# Patient Record
Sex: Male | Born: 1980
Health system: Southern US, Community
[De-identification: ages and names within clinical notes are randomized; demographics above are authoritative.]

## PROBLEM LIST (undated history)

## (undated) DIAGNOSIS — E162 Hypoglycemia, unspecified: Secondary | ICD-10-CM

## (undated) DIAGNOSIS — F32A Depression, unspecified: Secondary | ICD-10-CM

## (undated) DIAGNOSIS — F329 Major depressive disorder, single episode, unspecified: Secondary | ICD-10-CM

## (undated) DIAGNOSIS — I10 Essential (primary) hypertension: Secondary | ICD-10-CM

## (undated) HISTORY — DX: Essential (primary) hypertension: I10

## (undated) HISTORY — DX: Major depressive disorder, single episode, unspecified: F32.9

## (undated) HISTORY — DX: Depression, unspecified: F32.A

---

## 2011-06-09 ENCOUNTER — Other Ambulatory Visit: Payer: Self-pay

## 2011-06-09 ENCOUNTER — Emergency Department (INDEPENDENT_AMBULATORY_CARE_PROVIDER_SITE_OTHER): Payer: Federal, State, Local not specified - PPO

## 2011-06-09 ENCOUNTER — Encounter (HOSPITAL_BASED_OUTPATIENT_CLINIC_OR_DEPARTMENT_OTHER): Payer: Self-pay | Admitting: *Deleted

## 2011-06-09 ENCOUNTER — Emergency Department (HOSPITAL_BASED_OUTPATIENT_CLINIC_OR_DEPARTMENT_OTHER)
Admission: EM | Admit: 2011-06-09 | Discharge: 2011-06-09 | Disposition: A | Discharge status: Home / Self Care | Payer: Federal, State, Local not specified - PPO | Attending: Emergency Medicine | Admitting: Emergency Medicine

## 2011-06-09 DIAGNOSIS — R079 Chest pain, unspecified: Secondary | ICD-10-CM | POA: Insufficient documentation

## 2011-06-09 DIAGNOSIS — R0602 Shortness of breath: Secondary | ICD-10-CM | POA: Insufficient documentation

## 2011-06-09 DIAGNOSIS — M25569 Pain in unspecified knee: Secondary | ICD-10-CM

## 2011-06-09 HISTORY — DX: Hypoglycemia, unspecified: E16.2

## 2011-06-09 LAB — DIFFERENTIAL
Basophils Relative: 0 % (ref 0–1)
Lymphocytes Relative: 42 % (ref 12–46)
Lymphs Abs: 3.8 10*3/uL (ref 0.7–4.0)
Monocytes Absolute: 0.7 10*3/uL (ref 0.1–1.0)
Monocytes Relative: 8 % (ref 3–12)
Neutro Abs: 4.3 10*3/uL (ref 1.7–7.7)
Neutrophils Relative %: 47 % (ref 43–77)

## 2011-06-09 LAB — BASIC METABOLIC PANEL
BUN: 19 mg/dL (ref 6–23)
CO2: 28 mEq/L (ref 19–32)
Chloride: 101 mEq/L (ref 96–112)
Creatinine, Ser: 0.9 mg/dL (ref 0.50–1.35)
GFR calc Af Amer: >90 mL/min (ref >90)
Potassium: 3.5 mEq/L (ref 3.5–5.1)

## 2011-06-09 LAB — CBC
HCT: 42.7 % (ref 39.0–52.0)
Hemoglobin: 15.1 g/dL (ref 13.0–17.0)
MCHC: 35.4 g/dL (ref 30.0–36.0)
RBC: 5.36 MIL/uL (ref 4.22–5.81)

## 2011-06-09 LAB — D-DIMER, QUANTITATIVE: D-Dimer, Quant: <0.22 ug/mL-FEU (ref 0.00–0.48)

## 2011-06-09 NOTE — ED Provider Notes (Signed)
History   Steven Wilkins is a 31 y.o. male who presents to the Emergency Department complaining of 7:31pm   CSN: [id number]  Arrival date & time 06/09/11  1715   First MD Initiated Contact with Patient 06/09/11 1918      Chief Complaint  Patient presents with  . Knee Pain  . Chest Pain    (Consider location/radiation/quality/duration/timing/severity/associated sxs/prior treatment) HPI Steven Wilkins is a 31 y.o. male who presents to the Emergency Department complaining of intermittent, moderate, sharp left knee pain that has been occuring for the past 2 months. Patient states that the pain is in the back of his left knee and gets worse when he stands initially but better after walking. He is a Paramedic and walks throughout the day.  Patient also reports new left-sided chest pain, chest tightness, SOB, and fatigue that occurred this morning when he he was driving. Patient states the chest pain went away with breathing and relaxation. Patient also notes the chest pain and SOB occurred 2 weeks ago but subsided without intervention. Patient also states the leg pain radiates to his lower back. Patient denies leg swelling, wheezing, coughing, fever, chills, and numbness, tingling, or weakness in his extremities. Patient denies any recent trips or travel. Patient denies h/o blood clots or cancer. Patient does note a h/o smoking, but has recently stopped. Patient is able to ambulate.   PCP: Dr. Katrinka Blazing    Past Medical History  Diagnosis Date  . Hypoglycemia     History reviewed. No pertinent past surgical history.  No family history on file.  History  Substance Use Topics  . Smoking status: Former Smoker    Quit date: 05/19/2011  . Smokeless tobacco: Never Used  . Alcohol Use: 8.4 oz/week    2 Glasses of wine, 12 Cans of beer per week      Review of Systems A complete 10 system review of systems was obtained and is otherwise negative except as noted in the HPI and PMH.    Allergies  Review of patient's allergies indicates no known allergies.  Home Medications   Current Outpatient Rx  Name Route Sig Dispense Refill  . ADULT MULTIVITAMIN W/MINERALS CH Oral Take 1 tablet by mouth daily.      BP 136/76  Pulse 64  Temp(Src) 98.1 F (36.7 C) (Oral)  Resp 18  Ht 5\' 11"  (1.803 m)  Wt 180 lb (81.647 kg)  BMI 25.10 kg/m2  SpO2 100%  Physical Exam  Nursing note and vitals reviewed. Constitutional: He is oriented to person, place, and time. He appears well-developed and well-nourished. No distress.  HENT:  Head: Normocephalic and atraumatic.  Eyes: EOM are normal. Pupils are equal, round, and reactive to light.  Neck: Neck supple. No tracheal deviation present.  Cardiovascular: Normal rate, regular rhythm, normal heart sounds and intact distal pulses.  Exam reveals no gallop and no friction rub.   No murmur heard. Pulmonary/Chest: Effort normal and breath sounds normal. No respiratory distress. He has no wheezes. He has no rales. He exhibits no tenderness.  Abdominal: Soft. Bowel sounds are normal. He exhibits no distension and no mass. There is no tenderness. There is no rebound and no guarding.  Musculoskeletal: Normal range of motion. He exhibits no edema.       Left lateral popliteal tenderness to palpation. No fullness. Left anterior and posterior drawer test normal. Left knee: no swelling or deformations. Min patella ttp. Distal pulses intact. No swelling or ttp of calf  Neurological: He is alert and oriented to person, place, and time. No sensory deficit.  Skin: Skin is warm and dry.  Psychiatric: He has a normal mood and affect. His behavior is normal.    Date: 06/10/2011  Rate: 69  Rhythm: normal sinus rhythm and sinus arrhythmia  QRS Axis: normal  Intervals: normal  ST/T Wave abnormalities: normal  Conduction Disutrbances:none  Narrative Interpretation:   Old EKG Reviewed: none available   ED Course  Procedures (including critical  care time)  DIAGNOSTIC STUDIES: Oxygen Saturation is 100% on room air, normal by my interpretation.    COORDINATION OF CARE:     Labs Reviewed  BASIC METABOLIC PANEL  D-DIMER, QUANTITATIVE  CBC  DIFFERENTIAL   No results found.   1. Knee pain   2. Chest pain       MDM  Patient presents with multiple complaints. He has both knee pain and atypical chest pain which I feel are unrelated. He appears well here and states he feels like it is anxiety. Low risk ACS. PERC negative and negative d dimer. Exam not c/w DVT LE. Will have him f/u with his PMD. Given precautions for return. Comfortable with discharge home.    I personally performed the services described in this documentation, which was scribed in my presence. The recorded information has been reviewed and considered.        Forbes Cellar, MD 06/12/11 [phone]

## 2011-06-09 NOTE — ED Notes (Signed)
Pt reports pain in back of knee which started after running "tight"- also reports recurrent chest discomfort "pressure"- states sx started this am around 0800

## 2015-08-24 ENCOUNTER — Ambulatory Visit (INDEPENDENT_AMBULATORY_CARE_PROVIDER_SITE_OTHER): Payer: Federal, State, Local not specified - PPO | Admitting: Family Medicine

## 2015-08-24 VITALS — BP 135/76 | HR 60 | Temp 97.6°F | Resp 16 | Ht 71.5 in | Wt 189.0 lb

## 2015-08-24 DIAGNOSIS — F4323 Adjustment disorder with mixed anxiety and depressed mood: Secondary | ICD-10-CM

## 2015-08-24 MED ORDER — SERTRALINE HCL 50 MG PO TABS
50.0000 mg | ORAL_TABLET | Freq: Every day | ORAL | Status: DC
Start: 2015-08-24 — End: 2015-08-25

## 2015-08-24 NOTE — Patient Instructions (Signed)
     IF you received an x-ray today, you will receive an invoice from [facility]. Please contact [facility] at [phone] with questions or concerns regarding your invoice.   IF you received labwork today, you will receive an invoice from [facility]. Please contact [facility] at [phone] with questions or concerns regarding your invoice.   Our billing staff will not be able to assist you with questions regarding bills from these companies.  You will be contacted with the lab results as soon as they are available. The fastest way to get your results is to activate your My Chart account. Instructions are located on the last page of this paperwork. If you have not heard from us regarding the results in 2 weeks, please contact this office.      
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
## 2015-08-24 NOTE — Progress Notes (Deleted)
   08/24/2015 11:08 AM   DOB: 02-07-1981 / MRN: [id number]  SUBJECTIVE:  Kenard GowerRaul Mccready is a 35 y.o. male presenting for right sided back pain that started 4 days ago and is worsening.  He complains that the pain radiates to his abdomen. His pain is worse with movement, particularly bending over and going from seated to standing, and states that his abdominal pain is worse with these movements as well.  He has not tried anything for his symptoms.  Denies leg pain, leg weakness and numbness.  No change in bowel and bladder.  No dysuria, urgency, frequency.   He has No Known Allergies.   He  has a past medical history of Hypoglycemia.    He  reports that he quit smoking about 4 years ago. He has never used smokeless tobacco. He reports that he drinks about 8.4 oz of alcohol per week. He reports that he does not use illicit drugs. He  has no sexual activity history on file. The patient  has no past surgical history on file.  His family history is not on file.  Review of Systems  Constitutional: Negative for fever.  Gastrointestinal: Positive for abdominal pain. Negative for nausea, diarrhea and constipation.  Genitourinary: Negative for dysuria, urgency and frequency.  Musculoskeletal: Positive for myalgias and back pain. Negative for falls.  Neurological: Negative for dizziness and headaches.    Problem list and medications reviewed and updated by myself where necessary, and exist elsewhere in the encounter.   OBJECTIVE:  BP 135/76 mmHg  Pulse 60  Temp(Src) 97.6 F (36.4 C) (Oral)  Resp 16  Ht 5' 11.5" (1.816 m)  Wt 189 lb (85.73 kg)  BMI 26.00 kg/m2  SpO2 99%  Physical Exam  Constitutional: He is oriented to person, place, and time. He appears well-developed. He does not appear ill.  Eyes: Conjunctivae and EOM are normal. Pupils are equal, round, and reactive to light.  Cardiovascular: Normal rate.   Pulmonary/Chest: Effort normal.  Abdominal: He exhibits no distension.    Musculoskeletal: Normal range of motion.       Lumbar back: He exhibits tenderness and spasm. He exhibits normal range of motion, no bony tenderness, no edema and no deformity.       Back:  Neurological: He is alert and oriented to person, place, and time. He has normal strength. No cranial nerve deficit or sensory deficit. Coordination and gait normal. GCS eye subscore is 4. GCS verbal subscore is 5. GCS motor subscore is 6.  Reflex Scores:      Patellar reflexes are 2+ on the right side and 2+ on the left side.      Achilles reflexes are 2+ on the right side and 2+ on the left side. Skin: Skin is warm and dry. No rash noted. He is not diaphoretic.  Psychiatric: He has a normal mood and affect.  Nursing note and vitals reviewed.   No results found for this or any previous visit (from the past 72 hour(s)).  No results found.  ASSESSMENT AND PLAN  There are no diagnoses linked to this encounter.  The patient was advised to call or return to clinic if he does not see an improvement in symptoms or to seek the care of the closest emergency department if he worsens with the above plan.   Deliah BostonMichael Clark, MHS, PA-C [facility] 08/24/2015 11:08 AM

## 2015-08-25 ENCOUNTER — Telehealth: Payer: Self-pay | Admitting: Family Medicine

## 2015-08-25 DIAGNOSIS — F4323 Adjustment disorder with mixed anxiety and depressed mood: Secondary | ICD-10-CM

## 2015-08-25 MED ORDER — AMITRIPTYLINE HCL 25 MG PO TABS
25.0000 mg | ORAL_TABLET | Freq: Every evening | ORAL | Status: DC | PRN
Start: 2015-08-25 — End: 2017-09-13

## 2015-08-25 NOTE — Telephone Encounter (Signed)
Patient was started on Sertraline yesterday and when he took the medication it caused him to have a "bad stomach ache". When I asked for details he stated it made him feel nauseated, couldn't eat, changed his mood, felt "numb" all day, and just didn't feel right. He wants to know if you could prescribe a different medication for him to try. Please advise

## 2015-08-25 NOTE — Telephone Encounter (Signed)
Pt advised.

## 2015-08-27 ENCOUNTER — Telehealth: Payer: Self-pay

## 2015-08-27 NOTE — Telephone Encounter (Signed)
Patient brought in paperwork for Dr. Milus GlazierLauenstein to fill out and he wants to know how long Dr. Milus GlazierLauenstein stateD he should be out of work. Please call!  [phone]

## 2015-08-29 NOTE — Telephone Encounter (Signed)
Patient needs to remain out of work for one month starting April 10.

## 2016-02-27 DIAGNOSIS — K080 Exfoliation of teeth due to systemic causes: Secondary | ICD-10-CM | POA: Diagnosis not present

## 2016-12-11 DIAGNOSIS — J0100 Acute maxillary sinusitis, unspecified: Secondary | ICD-10-CM | POA: Diagnosis not present

## 2017-04-08 DIAGNOSIS — J3089 Other allergic rhinitis: Secondary | ICD-10-CM | POA: Diagnosis not present

## 2017-04-08 DIAGNOSIS — R51 Headache: Secondary | ICD-10-CM | POA: Diagnosis not present

## 2017-04-17 DIAGNOSIS — K080 Exfoliation of teeth due to systemic causes: Secondary | ICD-10-CM | POA: Diagnosis not present

## 2017-05-20 DIAGNOSIS — Z113 Encounter for screening for infections with a predominantly sexual mode of transmission: Secondary | ICD-10-CM | POA: Diagnosis not present

## 2017-09-13 ENCOUNTER — Encounter: Payer: Self-pay | Admitting: Medical

## 2017-09-13 ENCOUNTER — Ambulatory Visit: Payer: Federal, State, Local not specified - PPO | Admitting: Medical

## 2017-09-13 ENCOUNTER — Ambulatory Visit: Payer: Federal, State, Local not specified - PPO | Admitting: Emergency Medicine

## 2017-09-13 VITALS — BP 118/66 | HR 66 | Temp 98.0°F | Resp 16 | Ht 71.0 in | Wt 185.0 lb

## 2017-09-13 DIAGNOSIS — F329 Major depressive disorder, single episode, unspecified: Secondary | ICD-10-CM

## 2017-09-13 DIAGNOSIS — F419 Anxiety disorder, unspecified: Secondary | ICD-10-CM | POA: Diagnosis not present

## 2017-09-13 DIAGNOSIS — F32A Depression, unspecified: Secondary | ICD-10-CM

## 2017-09-13 MED ORDER — AMITRIPTYLINE HCL 25 MG PO TABS: 25.0000 mg | ORAL_TABLET | Freq: Every day | ORAL | 0 refills | Status: AC

## 2017-09-13 MED ORDER — HYDROXYZINE HCL 25 MG PO TABS: 25.0000 mg | ORAL_TABLET | Freq: Three times a day (TID) | ORAL | 0 refills | Status: AC | PRN

## 2017-09-13 MED FILL — AMITRIPTYLINE HCL 25 MG TAB: 25 | 30 days supply | Qty: 30 | Fill #0

## 2017-09-13 MED FILL — hydrOXYzine HCL 25 MG TABS: 25 | 20 days supply | Qty: 60 | Fill #0
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
## 2017-09-13 NOTE — Patient Instructions (Addendum)
For history of anxiety and depression, I did refill your amitriptyline and prescribed hydroxyzine.  Amitriptyline should help with your mood.  You can use hydroxyzine for anxiety as directed.  If you find that hydroxyzine is not helping then I could prescribe you buspirone.  You expressed desire not to be on a benzodiazepine.  Hopefully will not  be needed.  However if your anxiety becomes extreme please let me know and I can write you a short course.  If any extreme anxiety or depression after hours or weekend then recommend [facility].  Follow-up in 2 weeks or as needed.

## 2017-09-16 ENCOUNTER — Telehealth: Payer: Self-pay | Admitting: Medical

## 2017-09-16 NOTE — Telephone Encounter (Signed)
Copied from CRM [id number]. Topic: Quick Communication - See Telephone Encounter >> Sep 16, 2017  2:33 PM Valentina Lucks wrote: CRM for notification. See Telephone encounter for: 09/16/17.   Pt dropped off document to be filled out by provider (FMLA 2 pages with front and back). Pt would like document to be faxed to [phone] when document ready and also to call the pt to inform him that documents has been faxed. Document put at front office tray under providers name.

## 2017-09-27 ENCOUNTER — Ambulatory Visit: Payer: Federal, State, Local not specified - PPO | Admitting: Medical

## 2017-09-27 NOTE — Telephone Encounter (Signed)
New patient and cancelled appointment for today; forwarded paperwork to provider/SLS 05/17

## 2017-09-27 NOTE — Telephone Encounter (Signed)
I only saw patient one time. I called him and ask to follow to see how he is so I could know how to fill out form. I have only seen him one time. I left message to that effect on his phone. Would you mind calling him and asking him to come in next week.

## 2017-09-30 NOTE — Telephone Encounter (Signed)
Patient has appointment scheduled for 10/04/17 @ 1:00pm/SLS 05/20

## 2017-10-04 ENCOUNTER — Ambulatory Visit: Payer: Federal, State, Local not specified - PPO | Admitting: Medical

## 2017-10-04 ENCOUNTER — Encounter: Payer: Self-pay | Admitting: Medical

## 2017-10-04 VITALS — BP 119/63 | HR 63 | Temp 98.3°F | Resp 16 | Ht 71.0 in | Wt 188.4 lb

## 2017-10-04 DIAGNOSIS — F419 Anxiety disorder, unspecified: Secondary | ICD-10-CM | POA: Diagnosis not present

## 2017-10-04 DIAGNOSIS — F329 Major depressive disorder, single episode, unspecified: Secondary | ICD-10-CM

## 2017-10-04 DIAGNOSIS — F32A Depression, unspecified: Secondary | ICD-10-CM

## 2017-10-04 NOTE — Patient Instructions (Signed)
For your anxiety and depression, you could try to take half hydroxyzine tablet. Other meds I don't think will help as you have had severe side effects to depression meds.  I filled out your fmla form.  You can schedule appointment for July 3rd in case you need that visit. If you don't need then can cancel.  Follow up as needed  Reminder for extreme depression or anxiety seek eval [facility].

## 2017-10-04 NOTE — Progress Notes (Signed)
Subjective:    Patient ID: Steven Wilkins, male    DOB: 10-09-1980, 37 y.o.   MRN: [id number]  HPI  Pt in for follow up.  He has some moderate to severe anxiety recently as explained for reasons on last visit.  I had written him hydroxyzine for anxiety. He states he did not like the way he felt. He felt too sedated and out of it.  He does not want med such as xanax or buspar.  See last note. He had a lot of reactions to various meds such as SSRI.  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   Pt did have a court date on May 20,2019. They date was extended. Now he may get encarcerated by June or July.    Review of Systems  Constitutional: Negative for chills, fatigue and fever.  Respiratory: Negative for cough, chest tightness, shortness of breath and wheezing.   Cardiovascular: Negative for chest pain and palpitations.  Musculoskeletal: Negative for back pain and neck pain.  Skin: Negative for rash.  Neurological: Negative for dizziness, speech difficulty, weakness, numbness and headaches.  Hematological: Negative for adenopathy. Does not bruise/bleed easily.  Psychiatric/Behavioral: Positive for dysphoric mood. Negative for agitation, behavioral problems, confusion and sleep disturbance. The patient is nervous/anxious. The patient is not hyperactive.      Past Medical History:  Diagnosis Date  . Depression   . Hypertension   . Hypoglycemia      Social History   Socioeconomic History  . Marital status: Married    Spouse name: Not on file  . Number of children: Not on file  . Years of education: Not on file  . Highest education level: Not on file  Occupational History  . Not on file  Social Needs  . Financial resource strain: Not on file  . Food insecurity:    Worry: Not on file    Inability: Not on file  . Transportation needs:    Medical: Not on file    Non-medical: Not on file  Tobacco Use  . Smoking status: Former Smoker   Last attempt to quit: 05/19/2011    Years since quitting: 6.3  . Smokeless tobacco: Never Used  Substance and Sexual Activity  . 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  . Drug use: No  . Sexual activity: Yes  Lifestyle  . Physical activity:    Days per week: Not on file    Minutes per session: Not on file  . Stress: Not on file  Relationships  . Social connections:    Talks on phone: Not on file    Gets together: Not on file    Attends religious service: Not on file    Active member of club or organization: Not on file    Attends meetings of clubs or organizations: Not on file    Relationship status: Not on file  . Intimate partner violence:    Fear of current or ex partner: Not on file    Emotionally abused: Not on file    Physically abused: Not on file    Forced sexual activity: Not on file  Other Topics Concern  . Not on file  Social History Narrative  . Not on file    No past  surgical history on file.  No family history on file.  No Known Allergies  Current Outpatient Medications on File Prior to Visit  Medication Sig Dispense Refill  . amitriptyline (ELAVIL) 25 MG tablet Take 1 tablet (25 mg total) by mouth at bedtime. 30 tablet 0  . hydrOXYzine (ATARAX/VISTARIL) 25 MG tablet Take 1 tablet (25 mg total) by mouth 3 (three) times daily as needed for anxiety. 60 tablet 0  . Multiple Vitamin (MULITIVITAMIN WITH MINERALS) TABS Take 1 tablet by mouth daily. Reported on 08/24/2015     No current facility-administered medications on file prior to visit.     BP 119/63   Pulse 63   Temp 98.3 F (36.8 C) (Oral)   Resp 16   Ht  (1.803 m)   Wt 188 lb 6.4 oz (85.5 kg)   SpO2 100%   BMI 26.28 kg/m       Objective:   Physical Exam  General- No acute distress. Pleasant patient today but appears some stressed as  discusses upcoming court dates. Neck- Full range of motion, no jvd Lungs- Clear, even and unlabored. Heart- regular rate and rhythm. Neurologic- CNII- XII grossly intact.      Assessment & Plan:  For your anxiety and depression, you could try to take half hydroxyzine tablet. Other meds I don't think will help as you have had severe side effects to depression meds.  I filled out your fmla form.  You can schedule appointment for July 3rd in case you need that visit. If you don't need then can cancel.  Follow up as needed  Reminder for extreme depression or anxiety seek eval [facility].  Esperanza Richters, PA-C

## 2017-11-01 ENCOUNTER — Ambulatory Visit: Payer: Federal, State, Local not specified - PPO | Admitting: Medical

## 2017-11-13 ENCOUNTER — Ambulatory Visit: Payer: Federal, State, Local not specified - PPO | Admitting: Medical

## 2017-11-20 ENCOUNTER — Ambulatory Visit: Payer: Federal, State, Local not specified - PPO | Admitting: Medical

## 2017-11-20 ENCOUNTER — Encounter: Payer: Self-pay | Admitting: Medical

## 2017-11-20 VITALS — BP 118/61 | HR 61 | Temp 98.4°F | Resp 16 | Ht 71.0 in | Wt 191.4 lb

## 2017-11-20 DIAGNOSIS — F419 Anxiety disorder, unspecified: Secondary | ICD-10-CM | POA: Diagnosis not present

## 2017-11-20 DIAGNOSIS — F329 Major depressive disorder, single episode, unspecified: Secondary | ICD-10-CM | POA: Diagnosis not present

## 2017-11-20 DIAGNOSIS — F32A Depression, unspecified: Secondary | ICD-10-CM

## 2017-11-20 MED ORDER — CLONAZEPAM 0.25 MG PO TBDP: ORAL_TABLET | ORAL | 0 refills | Status: AC

## 2017-11-20 NOTE — Progress Notes (Signed)
Subjective:    Patient ID: Steven Wilkins, male    DOB: December 17, 1980, 37 y.o.   MRN: [id number]  HPI  Pt in for follow up.  I had filled out paperwork for him to take off for work in past.  Pt states work is threatening him that he needs to go back to work or he may be fired. Then he states union would have to fight his dismissal if that came to it.   Pt states his court date was moved again. He is still expressing some stress and anxiety.  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  Pt has elavil and hydroyxzine. He states he did not like side effect of med. He states he had clouded thinking. He prefers not to use any medication. Still some stress and anxiety but controlled most of time. Not reporting depression.  Pt is willing to dry benzodiazepne today. Discussed use of clonazepam.   Review of Systems  Constitutional: Negative for chills, diaphoresis, fatigue and fever.  HENT: Negative for dental problem.   Respiratory: Negative for cough, chest tightness, shortness of breath and wheezing.   Cardiovascular: Negative for chest pain and palpitations.  Gastrointestinal: Negative for abdominal pain.  Genitourinary: Negative for difficulty urinating, enuresis, flank pain and frequency.  Musculoskeletal: Negative for back pain.  Skin: Negative for rash.  Neurological: Negative for dizziness, seizures, syncope, speech difficulty, weakness, light-headedness, numbness and headaches.  Psychiatric/Behavioral: Negative for behavioral problems, confusion, dysphoric mood, self-injury, sleep disturbance and suicidal ideas. The patient is nervous/anxious.        No suicidal or homicidal ideation. Discussed with pt wording that his employer wanted me to write on his return to work note.    Past Medical History:  Diagnosis Date  . Depression   . Hypertension   . Hypoglycemia      Social History   Socioeconomic History  . Marital status:  Married    Spouse name: Not on file  . Number of children: Not on file  . Years of education: Not on file  . Highest education level: Not on file  Occupational History  . Not on file  Social Needs  . Financial resource strain: Not on file  . Food insecurity:    Worry: Not on file    Inability: Not on file  . Transportation needs:    Medical: Not on file    Non-medical: Not on file  Tobacco Use  . Smoking status: Former Smoker    Last attempt to quit: 05/19/2011    Years since quitting: 6.5  . Smokeless tobacco: Never Used  Substance and Sexual Activity  . 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  . Drug use: No  . Sexual activity: Yes  Lifestyle  . Physical activity:    Days per week: Not on file    Minutes per session: Not on file  . Stress: Not on file  Relationships  . Social connections:    Talks on phone: Not on file    Gets together: Not on file    Attends religious service: Not on file    Active member of club or organization: Not on file    Attends meetings of clubs or organizations: Not on file  Relationship status: Not on file  . Intimate partner violence:    Fear of current or ex partner: Not on file    Emotionally abused: Not on file    Physically abused: Not on file    Forced sexual activity: Not on file  Other Topics Concern  . Not on file  Social History Narrative  . Not on file    No past surgical history on file.  No family history on file.  No Known Allergies  Current Outpatient Medications on File Prior to Visit  Medication Sig Dispense Refill  . amitriptyline (ELAVIL) 25 MG tablet Take 1 tablet (25 mg total) by mouth at bedtime. 30 tablet 0  . hydrOXYzine (ATARAX/VISTARIL) 25 MG tablet Take 1 tablet (25 mg total) by mouth 3 (three) times daily as needed for anxiety. 60 tablet 0  . Multiple  Vitamin (MULITIVITAMIN WITH MINERALS) TABS Take 1 tablet by mouth daily. Reported on 08/24/2015     No current facility-administered medications on file prior to visit.     BP 118/61   Pulse 61   Temp 98.4 F (36.9 C) (Oral)   Resp 16   Ht 5\' 11"  (1.803 m)   Wt 191 lb 6.4 oz (86.8 kg)   SpO2 100%   BMI 26.69 kg/m      Objective:   Physical Exam  General Mental Status- Alert. General Appearance- Not in acute distress.   Neck Carotid Arteries- Normal color. Moisture- Normal Moisture. No carotid bruits. No JVD.  Chest and Lung Exam,  Auscultation: Breath Sounds:-Normal.  Cardiovascular Auscultation:Rythm- Regular. Murmurs & Other Heart Sounds:Auscultation of the heart reveals- No Murmurs.  Neurologic Cranial Nerve exam:- CN III-XII intact(No nystagmus), symmetric smile. Strength:- 5/5 equal and symmetric strength both upper and lower extremities.      Assessment & Plan:  Your anxiety and depression appear to be stable despite the challenging circumstances.  Prior medications Elavil and hydroxyzine caused side effects.  Those have been discontinued.  Having medication for severe anxiety or panic attacks might still be beneficial.  I will prescribe you 4 tablets of low-dose clonazepam.  Between now and your upcoming court date would recommend that you try 1 tablet to see if it causes any significant adverse side effects.  If it controls anxiety without side effects then you may benefit from 1tablet on the day of her court proceedings.  I did write a return to work note and included the portion regarding you not being a threat to self or others.  I included this at your request.  Honestly that did not cross her mind as you do appear stable presently.  Follow-up in 3 to 4 weeks or as needed.  In light of letter written as he requested, I did ask that he contact me with update on any worsening mood, anxiety, or thoughts of harm to self or others.  Esperanza RichtersEdward Shaaron Golliday, PA-C

## 2017-11-20 NOTE — Patient Instructions (Addendum)
Your anxiety and depression appear to be stable despite the challenging circumstances.  Prior medications Elavil and hydroxyzine caused side effects.  Those have been discontinued.  Having medication for severe anxiety or panic attacks might still be beneficial.  I will prescribe you 4 tablets of low-dose clonazepam.  Between now and your upcoming court date would recommend that you try 1 tablet to see if it causes any significant adverse side effects.  If it controls anxiety without side effects then you may benefit from 1 tablet on the day of her court proceedings.  I did write a return to work note and included the portion regarding you not being a threat to self or others.  I included this at your request.  Honestly that did not cross her mind as you do appear stable presently.  Follow-up in 3 to 4 weeks or as needed.

## 2018-12-05 DIAGNOSIS — Z1159 Encounter for screening for other viral diseases: Secondary | ICD-10-CM | POA: Diagnosis not present

## 2018-12-30 DIAGNOSIS — L720 Epidermal cyst: Secondary | ICD-10-CM | POA: Diagnosis not present

## 2019-03-03 DIAGNOSIS — Z20828 Contact with and (suspected) exposure to other viral communicable diseases: Secondary | ICD-10-CM | POA: Diagnosis not present

## 2019-09-28 DIAGNOSIS — Z1152 Encounter for screening for COVID-19: Secondary | ICD-10-CM | POA: Diagnosis not present

## 2019-09-28 DIAGNOSIS — K2970 Gastritis, unspecified, without bleeding: Secondary | ICD-10-CM | POA: Diagnosis not present

## 2019-09-28 DIAGNOSIS — R197 Diarrhea, unspecified: Secondary | ICD-10-CM | POA: Diagnosis not present

## 2020-01-27 DIAGNOSIS — Z03818 Encounter for observation for suspected exposure to other biological agents ruled out: Secondary | ICD-10-CM | POA: Diagnosis not present

## 2020-01-27 DIAGNOSIS — Z20822 Contact with and (suspected) exposure to covid-19: Secondary | ICD-10-CM | POA: Diagnosis not present

## 2020-08-18 ENCOUNTER — Other Ambulatory Visit: Payer: Self-pay

## 2020-08-18 ENCOUNTER — Emergency Department (HOSPITAL_BASED_OUTPATIENT_CLINIC_OR_DEPARTMENT_OTHER)
Admission: EM | Admit: 2020-08-18 | Discharge: 2020-08-18 | Disposition: A | Discharge status: Home / Self Care | Attending: Emergency Medicine | Admitting: Emergency Medicine

## 2020-08-18 ENCOUNTER — Emergency Department (HOSPITAL_BASED_OUTPATIENT_CLINIC_OR_DEPARTMENT_OTHER)

## 2020-08-18 ENCOUNTER — Encounter (HOSPITAL_BASED_OUTPATIENT_CLINIC_OR_DEPARTMENT_OTHER): Payer: Self-pay

## 2020-08-18 DIAGNOSIS — I10 Essential (primary) hypertension: Secondary | ICD-10-CM | POA: Diagnosis not present

## 2020-08-18 DIAGNOSIS — W208XXA Other cause of strike by thrown, projected or falling object, initial encounter: Secondary | ICD-10-CM | POA: Diagnosis not present

## 2020-08-18 DIAGNOSIS — S6992XA Unspecified injury of left wrist, hand and finger(s), initial encounter: Secondary | ICD-10-CM | POA: Diagnosis present

## 2020-08-18 DIAGNOSIS — S60415A Abrasion of left ring finger, initial encounter: Secondary | ICD-10-CM | POA: Diagnosis not present

## 2020-08-18 DIAGNOSIS — Y990 Civilian activity done for income or pay: Secondary | ICD-10-CM | POA: Insufficient documentation

## 2020-08-18 DIAGNOSIS — Z87891 Personal history of nicotine dependence: Secondary | ICD-10-CM | POA: Insufficient documentation

## 2020-08-18 DIAGNOSIS — S63502A Unspecified sprain of left wrist, initial encounter: Secondary | ICD-10-CM | POA: Diagnosis not present

## 2020-08-18 MED ORDER — TETANUS-DIPHTH-ACELL PERTUSSIS 5-2.5-18.5 LF-MCG/0.5 IM SUSY
0.5000 mL | PREFILLED_SYRINGE | Freq: Once | INTRAMUSCULAR | Status: DC
  Filled 2020-08-18: qty 0.5

## 2020-08-18 NOTE — ED Provider Notes (Signed)
[facility] EMERGENCY DEPARTMENT Provider Note   CSN: [id number] Arrival date & time: 08/18/20  1243     History Chief Complaint  Patient presents with  . Wrist Injury    Steven Wilkins is a 40 y.o. male.  HPI 40 year male presents with left wrist injury.  He was at work and something metal fell on his hand, flexing his wrist.  He states he now has pain whenever he moves his wrist.  No swelling.  No pain at rest otherwise.  No hand pain.  He has a small abrasion but otherwise no pain, numbness, weakness.   Past Medical History:  Diagnosis Date  . Depression   . Hypertension   . Hypoglycemia     There are no problems to display for this patient.   History reviewed. No pertinent surgical history.     No family history on file.  Social History   Tobacco Use  . Smoking status: Former Smoker    Quit date: 05/19/2011    Years since quitting: 9.2  . Smokeless tobacco: Never Used  Substance Use Topics  . Alcohol use: Not Currently  . Drug use: No    Home Medications Prior to Admission medications   Medication Sig Start Date End Date Taking? Authorizing Provider  amitriptyline (ELAVIL) 25 MG tablet Take 1 tablet (25 mg total) by mouth at bedtime. 09/13/17   Saguier, Ramon Dredge, PA-C  clonazePAM (KLONOPIN) 0.25 MG disintegrating tablet 1 tab po bid as needed severe anxiety or panic attack 11/20/17   Saguier, Ramon Dredge, PA-C  hydrOXYzine (ATARAX/VISTARIL) 25 MG tablet Take 1 tablet (25 mg total) by mouth 3 (three) times daily as needed for anxiety. 09/13/17   Saguier, Ramon Dredge, PA-C  Multiple Vitamin (MULITIVITAMIN WITH MINERALS) TABS Take 1 tablet by mouth daily. Reported on 08/24/2015    [provider]    Allergies    Patient has no known allergies.  Review of Systems   Review of Systems  Musculoskeletal: Positive for arthralgias. Negative for joint swelling.    Physical Exam Updated Vital Signs BP 133/90 (BP Location: Right Arm)   Pulse 70   Temp 98.5 F  (36.9 C) (Oral)   Resp 16   Ht 5\' 11"  (1.803 m)   Wt 88.5 kg   SpO2 99%   BMI 27.20 kg/m   Physical Exam Vitals and nursing note reviewed.  Constitutional:      Appearance: He is well-developed.  HENT:     Head: Normocephalic and atraumatic.     Right Ear: External ear normal.     Left Ear: External ear normal.     Nose: Nose normal.  Eyes:     General:        Right eye: No discharge.        Left eye: No discharge.  Cardiovascular:     Rate and Rhythm: Normal rate and regular rhythm.     Pulses:          Radial pulses are 2+ on the left side.  Pulmonary:     Effort: Pulmonary effort is normal.  Abdominal:     General: There is no distension.  Musculoskeletal:     Left wrist: Tenderness (mild, palmar wrist) present. Normal range of motion.     Left hand: No swelling or tenderness. Normal range of motion.     Cervical back: Neck supple.     Comments: Small abrasion on left ring finger Full ROM of left wrist, though has pain on  palmar side with flexison Normal radial, ulnar, median nerve testing in left hand.   Skin:    General: Skin is warm and dry.  Neurological:     Mental Status: He is alert.  Psychiatric:        Mood and Affect: Mood is not anxious.     ED Results / Procedures / Treatments   Labs (all labs ordered are listed, but only abnormal results are displayed) Labs Reviewed - No data to display  EKG None  Radiology DG Wrist Complete Left  Result Date: 08/18/2020 CLINICAL DATA:  Metal clip and fell on the left wrist today at work. Posterior wrist pain. EXAM: LEFT WRIST - COMPLETE 3+ VIEW COMPARISON:  None. FINDINGS: There is no evidence of fracture or dislocation. There is no evidence of arthropathy or other focal bone abnormality. Soft tissues are unremarkable. IMPRESSION: Negative. Electronically Signed   By: Amie Portland M.D.   On: 08/18/2020 13:31    Procedures Procedures   Medications Ordered in ED Medications  Tdap (BOOSTRIX) injection 0.5  mL (has no administration in time range)    ED Course  I have reviewed the triage vital signs and the nursing notes.  Pertinent labs & imaging results that were available during my care of the patient were reviewed by me and considered in my medical decision making (see chart for details).    MDM Rules/Calculators/A&P                          Presentation is c/w left wrist sprain. Is NV intact. Doubt occult fracture. Xray viewed and is benign. No hand pain. Thinks his tetanus is up to date. Will give wrist splint, advise NSAIDs, ice, and f/u with PCP.  Final Clinical Impression(s) / ED Diagnoses Final diagnoses:  Sprain of left wrist, initial encounter    Rx / DC Orders ED Discharge Orders    None       Pricilla Loveless, MD 08/18/20 1511

## 2020-08-18 NOTE — ED Triage Notes (Signed)
Pt states metal equipment fell on left wrist at work ~1045am-NAD-steady gait

## 2020-08-24 ENCOUNTER — Emergency Department (HOSPITAL_BASED_OUTPATIENT_CLINIC_OR_DEPARTMENT_OTHER)
Admission: EM | Admit: 2020-08-24 | Discharge: 2020-08-24 | Disposition: A | Discharge status: Home / Self Care | Payer: Self-pay | Attending: Emergency Medicine | Admitting: Emergency Medicine

## 2020-08-24 ENCOUNTER — Other Ambulatory Visit: Payer: Self-pay

## 2020-08-24 DIAGNOSIS — I10 Essential (primary) hypertension: Secondary | ICD-10-CM | POA: Insufficient documentation

## 2020-08-24 DIAGNOSIS — R059 Cough, unspecified: Secondary | ICD-10-CM | POA: Insufficient documentation

## 2020-08-24 DIAGNOSIS — Z87891 Personal history of nicotine dependence: Secondary | ICD-10-CM | POA: Insufficient documentation

## 2020-08-24 DIAGNOSIS — J3489 Other specified disorders of nose and nasal sinuses: Secondary | ICD-10-CM | POA: Insufficient documentation

## 2020-08-24 DIAGNOSIS — J029 Acute pharyngitis, unspecified: Secondary | ICD-10-CM | POA: Insufficient documentation

## 2020-08-24 DIAGNOSIS — Z79899 Other long term (current) drug therapy: Secondary | ICD-10-CM | POA: Insufficient documentation

## 2020-08-24 DIAGNOSIS — R0981 Nasal congestion: Secondary | ICD-10-CM

## 2020-08-24 DIAGNOSIS — R0982 Postnasal drip: Secondary | ICD-10-CM

## 2020-08-24 MED ORDER — BENZONATATE 100 MG PO CAPS
100.0000 mg | ORAL_CAPSULE | Freq: Once | ORAL | Status: AC
  Administered 2020-08-24: 100 mg via ORAL
  Filled 2020-08-24: qty 1

## 2020-08-24 MED ORDER — BENZONATATE 100 MG PO CAPS: 100.0000 mg | ORAL_CAPSULE | Freq: Three times a day (TID) | ORAL | 0 refills | Status: AC

## 2020-08-24 NOTE — ED Provider Notes (Signed)
[facility] EMERGENCY DEPARTMENT Provider Note  CSN: [id number] Arrival date & time: 08/24/20 0546  Chief Complaint(s) Nasal Congestion and Sore Throat  HPI Steven Wilkins is a 40 y.o. male   The history is provided by the patient.  URI Presenting symptoms: congestion, cough, rhinorrhea and sore throat   Presenting symptoms: no fever   Severity:  Moderate Onset quality:  Gradual Duration:  2 days Timing:  Constant Progression:  Worsening Chronicity:  New Relieved by:  Nothing Exacerbated by: swallowing. Associated symptoms: no myalgias, no neck pain, no sinus pain, no swollen glands and no wheezing   Risk factors: no recent illness, no recent travel and no sick contacts     Past Medical History Past Medical History:  Diagnosis Date  . Depression   . Hypertension   . Hypoglycemia    There are no problems to display for this patient.  Home Medication(s) Prior to Admission medications   Medication Sig Start Date End Date Taking? Authorizing Provider  benzonatate (TESSALON) 100 MG capsule Take 1 capsule (100 mg total) by mouth every 8 (eight) hours. 08/24/20  Yes Dakia Schifano, Amadeo Garnet, MD  amitriptyline (ELAVIL) 25 MG tablet Take 1 tablet (25 mg total) by mouth at bedtime. 09/13/17   Saguier, Ramon Dredge, PA-C  clonazePAM (KLONOPIN) 0.25 MG disintegrating tablet 1 tab po bid as needed severe anxiety or panic attack 11/20/17   Saguier, Ramon Dredge, PA-C  hydrOXYzine (ATARAX/VISTARIL) 25 MG tablet Take 1 tablet (25 mg total) by mouth 3 (three) times daily as needed for anxiety. 09/13/17   Saguier, Ramon Dredge, PA-C  Multiple Vitamin (MULITIVITAMIN WITH MINERALS) TABS Take 1 tablet by mouth daily. Reported on 08/24/2015    [provider]                                                                                                                                    Past Surgical History No past surgical history on file. Family History No family history on file.  Social  History Social History   Tobacco Use  . Smoking status: Former Smoker    Quit date: 05/19/2011    Years since quitting: 9.2  . Smokeless tobacco: Never Used  Substance Use Topics  . Alcohol use: Not Currently  . Drug use: No   Allergies Patient has no known allergies.  Review of Systems Review of Systems  Constitutional: Negative for fever.  HENT: Positive for congestion, rhinorrhea and sore throat. Negative for sinus pain.   Respiratory: Positive for cough. Negative for wheezing.   Musculoskeletal: Negative for myalgias and neck pain.   All other systems are reviewed and are negative for acute change except as noted in the HPI  Physical Exam Vital Signs  I have reviewed the triage vital signs BP (!) 131/96 (BP Location: Left Arm)   Pulse 79   Temp 98.8 F (37.1 C) (Oral)   Resp 17   Ht 5\' 11"  (  1.803 m)   Wt 86.2 kg   SpO2 98%   BMI 26.50 kg/m   Physical Exam Vitals reviewed.  Constitutional:      General: He is not in acute distress.    Appearance: He is well-developed. He is not diaphoretic.  HENT:     Head: Normocephalic and atraumatic.     Right Ear: Tympanic membrane normal.     Left Ear: Tympanic membrane normal.     Nose: Mucosal edema, congestion and rhinorrhea present.     Mouth/Throat:     Lips: No lesions.     Tongue: No lesions.     Palate: No lesions.     Pharynx: No oropharyngeal exudate or uvula swelling.     Tonsils: No tonsillar exudate or tonsillar abscesses.     Comments: Post nasal drip with cobblestoning Eyes:     General: No scleral icterus.       Right eye: No discharge.        Left eye: No discharge.     Conjunctiva/sclera: Conjunctivae normal.     Pupils: Pupils are equal, round, and reactive to light.  Cardiovascular:     Rate and Rhythm: Normal rate and regular rhythm.     Heart sounds: No murmur heard. No friction rub. No gallop.   Pulmonary:     Effort: Pulmonary effort is normal. No respiratory distress.     Breath sounds:  Normal breath sounds. No stridor. No rales.  Abdominal:     General: There is no distension.     Palpations: Abdomen is soft.     Tenderness: There is no abdominal tenderness.  Musculoskeletal:        General: No tenderness.     Cervical back: Normal range of motion and neck supple.  Skin:    General: Skin is warm and dry.     Findings: No erythema or rash.  Neurological:     Mental Status: He is alert and oriented to person, place, and time.     ED Results and Treatments Labs (all labs ordered are listed, but only abnormal results are displayed) Labs Reviewed - No data to display                                                                                                                       EKG  EKG Interpretation  Date/Time:    Ventricular Rate:    PR Interval:    QRS Duration:   QT Interval:    QTC Calculation:   R Axis:     Text Interpretation:        Radiology No results found.  Pertinent labs & imaging results that were available during my care of the patient were reviewed by me and considered in my medical decision making (see chart for details).  Medications Ordered in ED Medications  benzonatate (TESSALON) capsule 100 mg (100 mg Oral Given 08/24/20 3009)  Procedures Procedures  (including critical care time)  Medical Decision Making / ED Course I have reviewed the nursing notes for this encounter and the patient's prior records (if available in EHR or on provided paperwork).   Steven Wilkins was evaluated in Emergency Department on 08/24/2020 for the symptoms described in the history of present illness. He was evaluated in the context of the global COVID-19 pandemic, which necessitated consideration that the patient might be at risk for infection with the SARS-CoV-2 virus that causes COVID-19. Institutional protocols and  algorithms that pertain to the evaluation of patients at risk for COVID-19 are in a state of rapid change based on information released by regulatory bodies including the CDC and federal and state organizations. These policies and algorithms were followed during the patient's care in the ED.  40 y.o. male presents with cough, rhinorrhea, and sore throat for 2 days. Adequate oral hydration. Rest of history as above.  Patient appears well. No signs of toxicity. No hypoxia, tachypnea or other signs of respiratory distress. No sign of clinical dehydration. Lung exam clear. Rest of exam as above.  Most consistent with allergic rhinitis/post nasal drip vs early viral upper respiratory infection.   No evidence suggestive of pharyngitis, AOM, PNA, or meningitis.  Chest x-ray not indicated at this time.  Discussed symptomatic treatment with the patient and they will follow closely with their PCP.      Final Clinical Impression(s) / ED Diagnoses Final diagnoses:  Nasal congestion  PND (post-nasal drip)    The patient appears reasonably screened and/or stabilized for discharge and I doubt any other medical condition or other [facility] requiring further screening, evaluation, or treatment in the ED at this time prior to discharge. Safe for discharge with strict return precautions.  Disposition: Discharge  Condition: Good  I have discussed the results, Dx and Tx plan with the patient/family who expressed understanding and agree(s) with the plan. Discharge instructions discussed at length. The patient/family was given strict return precautions who verbalized understanding of the instructions. No further questions at time of discharge.    ED Discharge Orders         Ordered    benzonatate (TESSALON) 100 MG capsule  Every 8 hours        08/24/20 0608            Follow Up: Marisue Brooklyn [address] [place] [address] [phone]  Call  if you have not been  called about your appointment, if symptoms do not improve or  worsen     This chart was dictated using voice recognition software.  Despite best efforts to proofread,  errors can occur which can change the documentation meaning.   Nira Conn, MD 08/24/20 [phone]

## 2020-08-24 NOTE — ED Triage Notes (Signed)
Pt via POV reports nasal congestion x2 days with sore throat. +headache. -dizziness. +cough. Denies being around anyone sick. Respirations regular/unlabored. O2 sats 98%.

## 2022-11-11 IMAGING — CR DG WRIST COMPLETE 3+V*L*
4 series · 4 of 4 positions shown · non-contrast
Comparison: None.

CLINICAL DATA: Metal clip and fell on the left wrist today at work.
Posterior wrist pain.

EXAM:
LEFT WRIST - COMPLETE 3+ VIEW

[x wrist pa left]
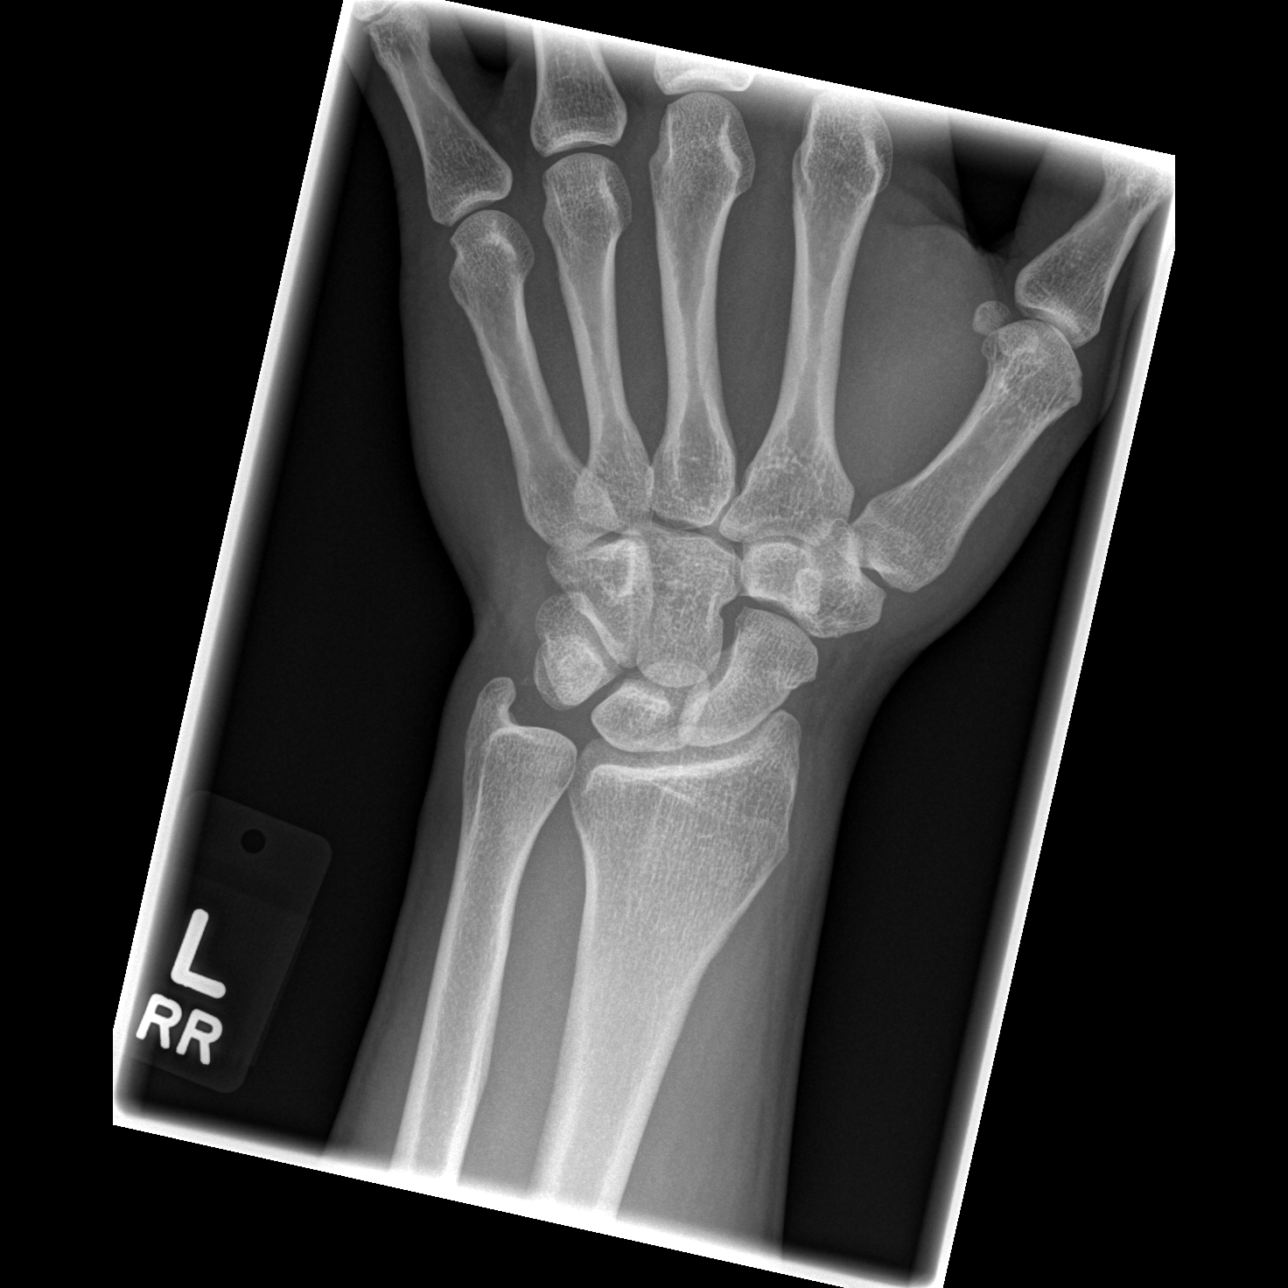

[x wrist obl left]
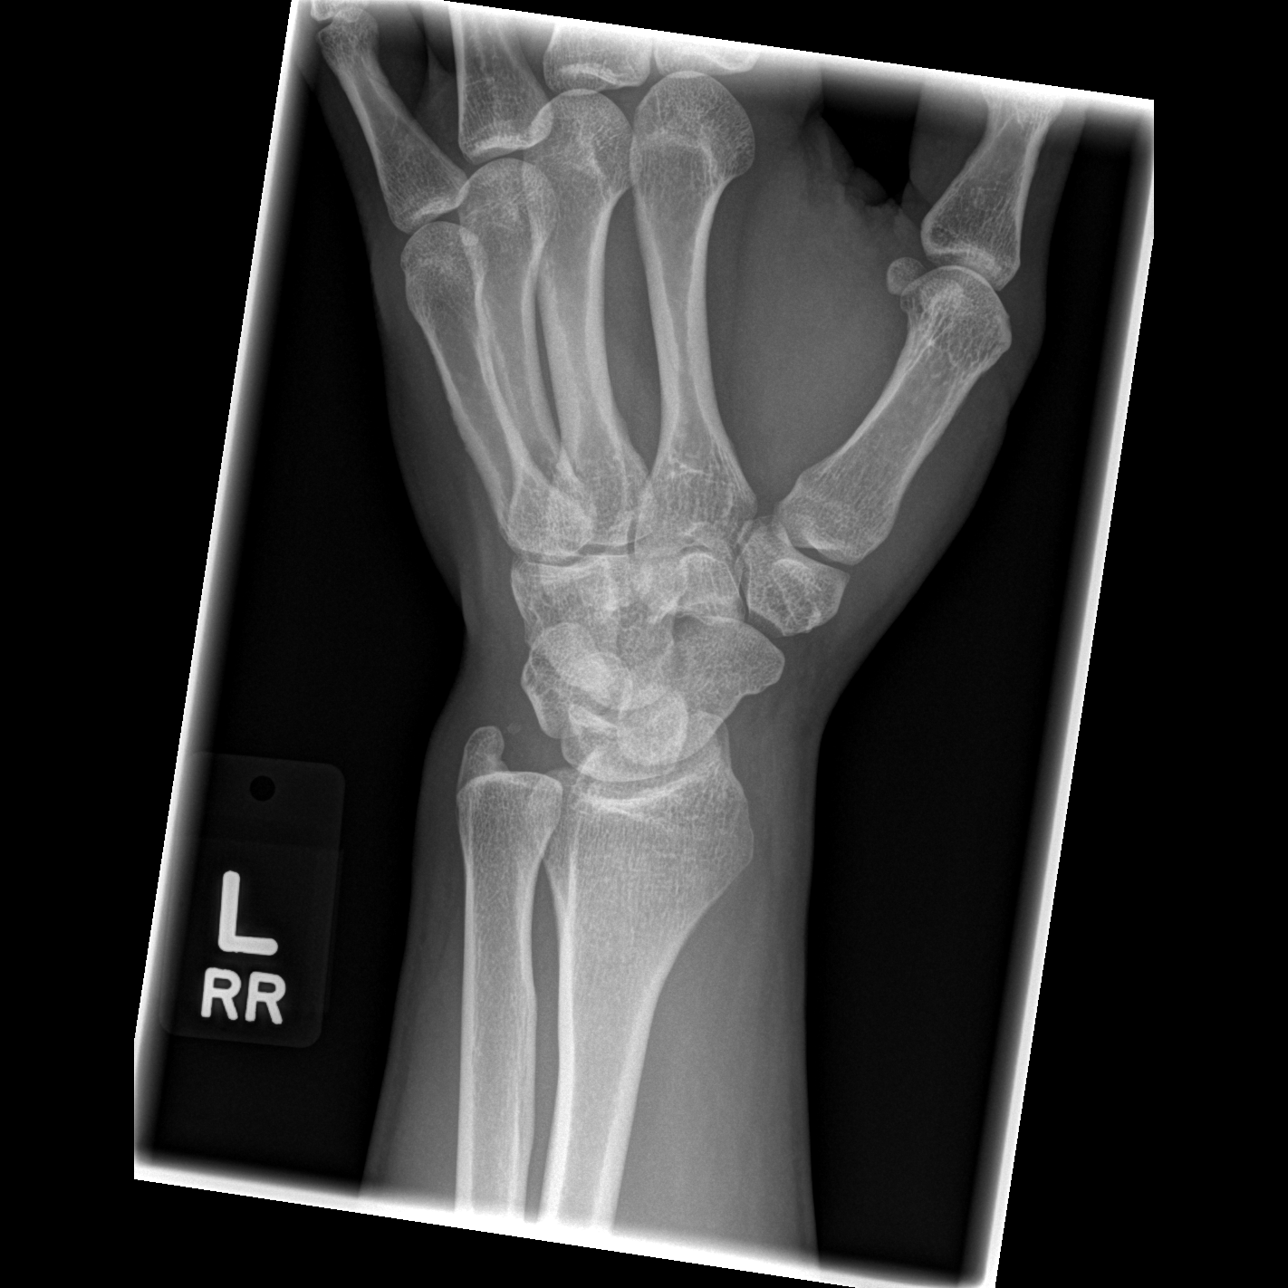

[x wrist lat left]
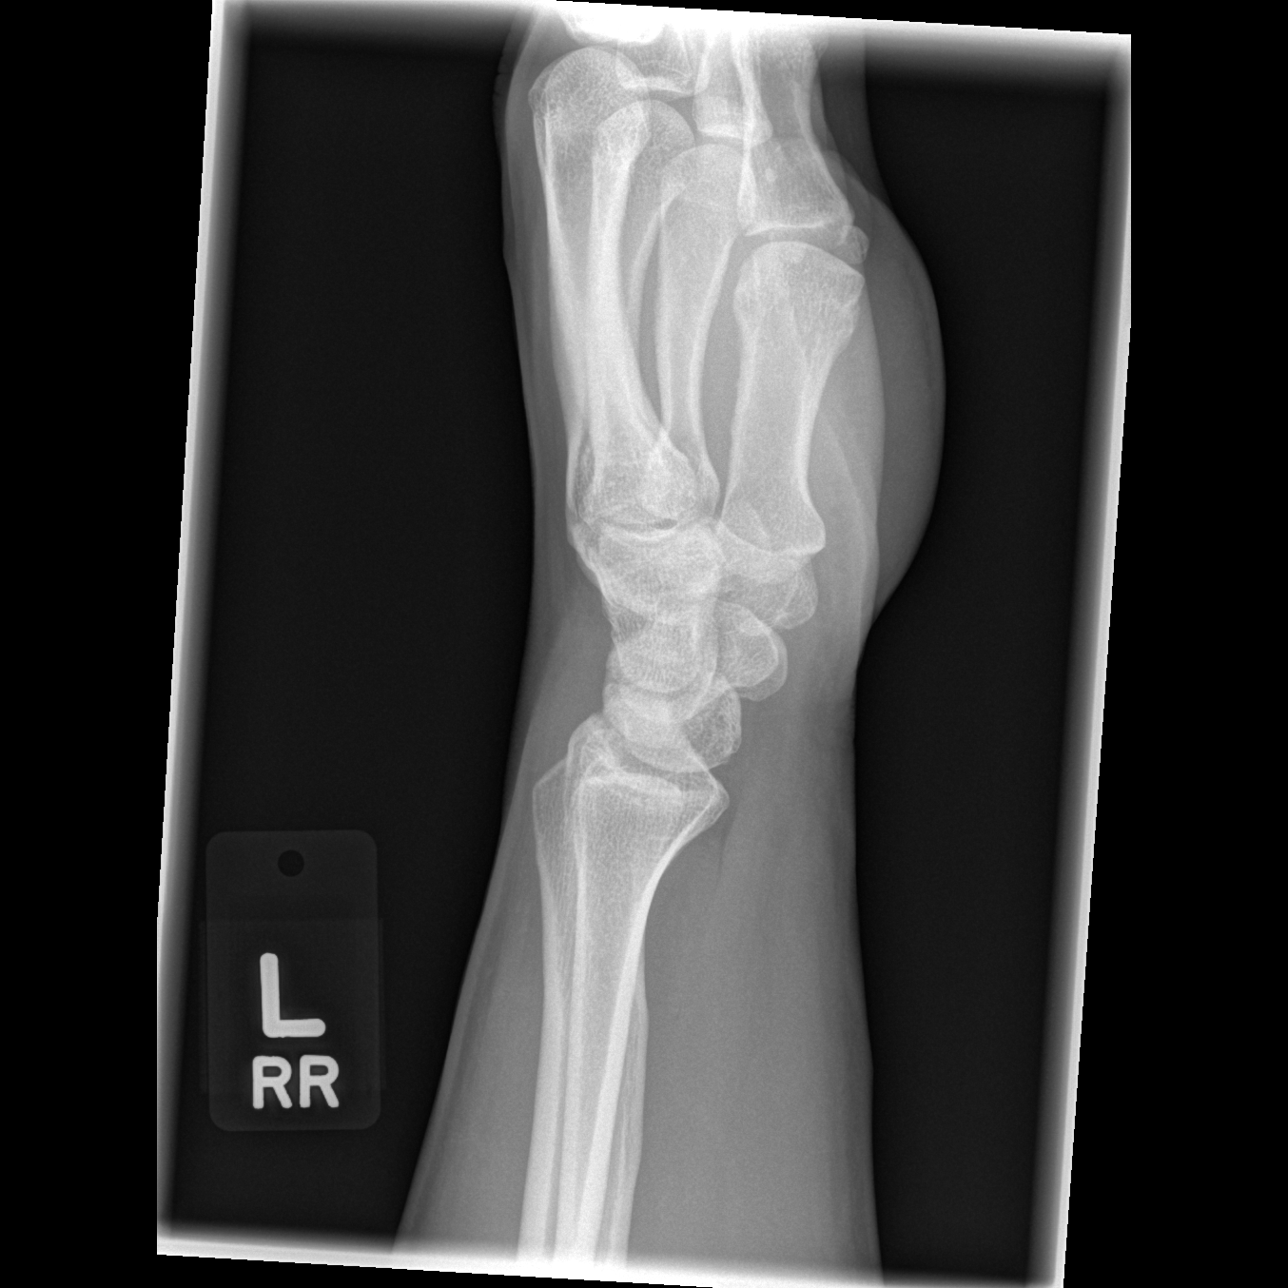

[x navicular]
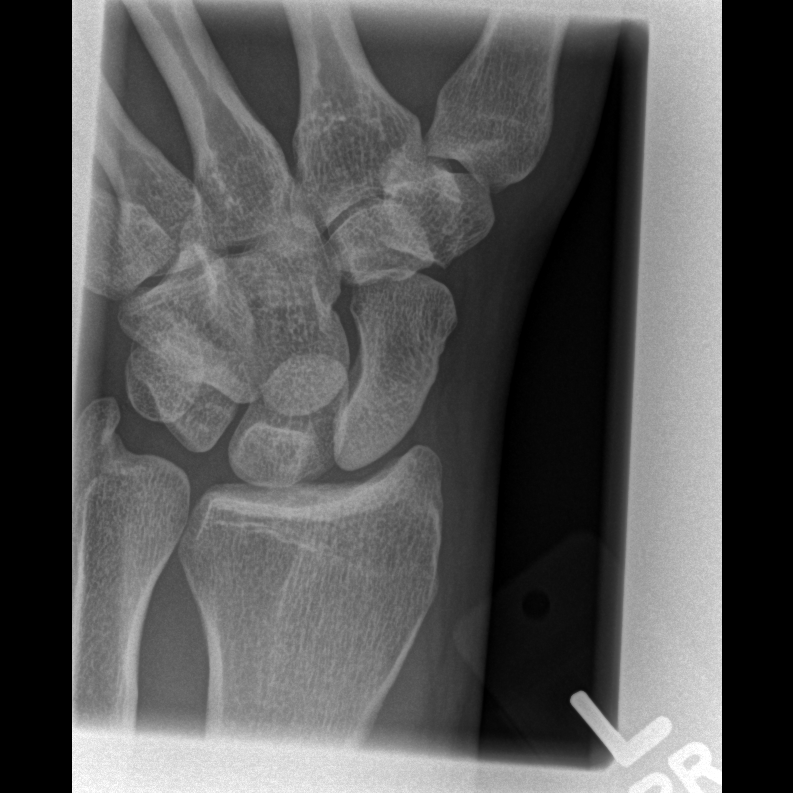

[4 of 4 positions shown; findings below may reference images not displayed]

FINDINGS: There is no evidence of fracture or dislocation. There is no
evidence of arthropathy or other focal bone abnormality. Soft
tissues are unremarkable.
IMPRESSION: Negative.
